# Patient Record
Sex: Male | Born: 1990 | Race: Black or African American | Hispanic: No | Marital: Single | Smoking: Never smoker
Health system: Southern US, Community
[De-identification: ages and names within clinical notes are randomized; demographics above are authoritative.]

## PROBLEM LIST (undated history)

## (undated) DIAGNOSIS — J45909 Unspecified asthma, uncomplicated: Secondary | ICD-10-CM

## (undated) DIAGNOSIS — T7840XA Allergy, unspecified, initial encounter: Secondary | ICD-10-CM

## (undated) HISTORY — PX: NO PAST SURGERIES: SHX2092

## (undated) HISTORY — DX: Allergy, unspecified, initial encounter: T78.40XA

## (undated) HISTORY — DX: Unspecified asthma, uncomplicated: J45.909

---
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
## 2013-08-07 ENCOUNTER — Ambulatory Visit: Payer: BC Managed Care – PPO

## 2013-08-07 ENCOUNTER — Ambulatory Visit: Payer: BC Managed Care – PPO | Admitting: Emergency Medicine

## 2013-08-07 VITALS — BP 104/82 | HR 46 | Temp 97.9°F | Resp 14 | Ht 68.5 in | Wt 171.0 lb

## 2013-08-07 DIAGNOSIS — M20019 Mallet finger of unspecified finger(s): Secondary | ICD-10-CM

## 2013-08-07 NOTE — Progress Notes (Signed)
[facility] [address], [place] [id number] [phone]  Date:  08/07/2013   Name:  Gilbert Buzzardrik M Fortner   DOB:  1990/08/02   MRN:  [id number]  PCP:  No PCP Per Patient    Chief Complaint: Hand Pain   History of Present Illness:  Gilbert [name] is a 23 y.o. very pleasant male patient who presents with the following:  Injured yesterday when he was struck in the tip of the left fourth finger by a basketball. Now cannot extend finger.  There are no active problems to display for this patient.   Past Medical History  Diagnosis Date  . Allergy   . Asthma     History reviewed. No pertinent past surgical history.  History  Substance Use Topics  . Smoking status: Never Smoker   . Smokeless tobacco: Not on file  . Alcohol Use: Not on file    History reviewed. No pertinent family history.  Allergies  Allergen Reactions  . Penicillins     Medication list has been reviewed and updated.  No current outpatient prescriptions on file prior to visit.   No current facility-administered medications on file prior to visit.    Review of Systems:  As per HPI, otherwise negative.    Physical Examination: Filed Vitals:   08/07/13 1608  BP: 104/82  Pulse: 46  Temp: 97.9 F (36.6 C)  Resp: 14   Filed Vitals:   08/07/13 1608  Height: 5' 8.5" (1.74 m)  Weight: 171 lb (77.565 kg)   Body mass index is 25.62 kg/(m^2). Ideal Body Weight: Weight in (lb) to have BMI = 25: 166.5   GEN: WDWN, NAD, Non-toxic, Alert & Oriented x 3 HEENT: Atraumatic, Normocephalic.  Ears and Nose: No external deformity. EXTR: No clubbing/cyanosis/edema NEURO: Normal gait.  PSYCH: Normally interactive. Conversant. Not depressed or anxious appearing.  Calm demeanor.  LEFT hand: mallet finger deformity left fourth finger  Assessment and Plan: Mallet finger   Signed,  Phillips OdorJeffery Krishiv Sandler, MD   [facility] reading (PRIMARY) by  Dr. Dareen PianoAnderson.  Mallet finger.

## 2013-08-07 NOTE — Patient Instructions (Signed)
Mallet Finger A mallet, or jammed, finger occurs when the end of a straightened finger or thumb receives a blow (often from a ball). This causes a disruption (tearing) of the extensor tendon (cord like structure which attaches muscle to bone) that straightens the end of your finger. The last joint in your finger will droop and you cannot extend it. Sometimes this is associated with a small fracture (break in bone) of the base of the end bone (phalange) in your finger. It usually takes 4 to 5 weeks to heal. HOME CARE INSTRUCTIONS   Apply ice to the sore finger for 15-20 minutes, 03-04 times per day for 2 days. Put the ice in a plastic bag and place a towel between the bag of ice and your skin.  If you have a finger splint, wear your splint as directed.  You may remove the splint to wash your finger or as directed.  If your splint is off, do not try to bend the tip of your finger.  Put your splint back on as soon as possible. If your finger is numb or tingling, the splint is probably too tight. You can loosen it so it is comfortable.  Move the part of your injured finger that is not covered by the splint several times a day.  Take medications as directed by your caregiver. Only take over-the-counter or prescription medicines for pain, discomfort, or fever as directed by your caregiver.  IMPORTANT: follow up with your caregiver or keep or call for any appointments with specialists as directed. The failure to follow up could result in chronic pain and / or disability. SEEK MEDICAL CARE IF:   You have increased pain or swelling.  You notice coldness of your finger.  After treatment you still cannot extend your finger. SEEK IMMEDIATE MEDICAL CARE IF:  Your finger is swollen and very red, white, blue, numb, cold, or tingling. MAKE SURE YOU:   Understand these instructions.  Will watch your condition.  Will get help right away if you are not doing well or get worse. Document Released:  03/20/2000 Document Revised: 06/15/2011 Document Reviewed: 11/04/2007 Exit[employer] Patient Information 2014 [employer].  

## 2013-09-12 ENCOUNTER — Telehealth: Payer: Self-pay

## 2013-09-12 NOTE — Telephone Encounter (Signed)
Pt has ortho appt on Thursday morning, grandmother needs xray disc of left index finger  Dois Davenport will pickup--needs Wednesday if possible  Dois Davenport  [phone]

## 2014-07-05 ENCOUNTER — Ambulatory Visit (INDEPENDENT_AMBULATORY_CARE_PROVIDER_SITE_OTHER): Payer: Self-pay | Admitting: Family Medicine

## 2014-07-05 VITALS — BP 118/80 | HR 52 | Temp 98.6°F | Ht 69.5 in | Wt 177.5 lb

## 2014-07-05 DIAGNOSIS — R059 Cough, unspecified: Secondary | ICD-10-CM

## 2014-07-05 DIAGNOSIS — R058 Other specified cough: Secondary | ICD-10-CM

## 2014-07-05 DIAGNOSIS — R05 Cough: Secondary | ICD-10-CM

## 2014-07-05 DIAGNOSIS — J209 Acute bronchitis, unspecified: Secondary | ICD-10-CM

## 2014-07-05 MED ORDER — BENZONATATE 100 MG PO CAPS: 100.0000 mg | ORAL_CAPSULE | Freq: Three times a day (TID) | ORAL | Status: DC | PRN

## 2014-07-05 MED ORDER — HYDROCODONE-HOMATROPINE 5-1.5 MG/5ML PO SYRP: 5.0000 mL | ORAL_SOLUTION | ORAL | Status: DC | PRN

## 2014-07-05 MED ORDER — AZITHROMYCIN 250 MG PO TABS: ORAL_TABLET | ORAL | Status: DC

## 2014-07-05 NOTE — Patient Instructions (Signed)
Drink plenty of fluids and get enough rest  Take the cough syrup 1 teaspoon every 4 hours as needed at nighttime. It may make you drowsy, and probably is not wise to take it when you're going to work.  Take the cough pills one or 2 pills 3 times daily as needed if necessary for daytime cough. This will not make you drowsy.  Take the antibiotic, azithromycin, 2 pills initially then 1 daily for 4 days  Return if worse or not improving, at which time we might need to do some further studies on you.

## 2014-07-05 NOTE — Progress Notes (Signed)
Subjective: [age] year old male who was treated elsewhere months ago for a bronchitis that he had been having. He was given some cough syrup when she is now finished. He's been coughing every day, but especially at nighttime. 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 He does have a history of springtime allergies. He has not had chronic springtime coughs like this. Nose runs a lot. He has not been running a fever. He does cough somewhat.  Objective: TMs normal. Throat clear. Neck supple without significant nodes. Chest is clear to auscultation. Heart regular without murmurs.  Assessment: Persistent bronchitis, probably a postviral cough to last this long. It could be pertussis.  Plan:  azithromycin Tessalon Hycodan  I do not believe chest x-ray or other labs are necessary today, but he is to return if he gets worse or is not improving

## 2014-07-11 ENCOUNTER — Ambulatory Visit (INDEPENDENT_AMBULATORY_CARE_PROVIDER_SITE_OTHER): Payer: Self-pay | Admitting: Family Medicine

## 2014-07-11 VITALS — BP 118/74 | HR 55 | Temp 97.6°F | Resp 17 | Ht 70.25 in | Wt 177.8 lb

## 2014-07-11 DIAGNOSIS — J069 Acute upper respiratory infection, unspecified: Secondary | ICD-10-CM

## 2014-07-11 DIAGNOSIS — J209 Acute bronchitis, unspecified: Secondary | ICD-10-CM

## 2014-07-11 MED ORDER — ALBUTEROL SULFATE HFA 108 (90 BASE) MCG/ACT IN AERS: 2.0000 | INHALATION_SPRAY | RESPIRATORY_TRACT | Status: AC | PRN

## 2014-07-11 MED ORDER — PREDNISONE 20 MG PO TABS: ORAL_TABLET | ORAL | Status: DC

## 2014-07-11 NOTE — Patient Instructions (Signed)
Continue your cough medications and finished the antibiotics  Use the inhaler 2 inhalations every 4-6 hours as needed for coughing or tightness in chest  Take the prednisone 3 pills daily for 2 days, then 2 daily for 2 days, then 1 daily for 2 days, then one half daily  Try to get plenty of rest and drink plenty of fluids  Return if not improving. I do not need to do labs and x-rays today, but if you are not improving over the next 5 or 6 days will need to put you through more testing.

## 2014-07-11 NOTE — Progress Notes (Signed)
Subjective: 24 year old man who was treated last week for a stretcher problem. However he did not give his medicines refilled for a couple of days. He still has one antibiotic pill left. The cough medication calms things down a little bit but these started coughing more again last night. He wheezes some.  Objective: His TMs are normal. Throat clear. Neck supple without significant nodes. Chest is clear below bit tight on forced expiration.  Assessment: Acute bronchitis with asthmatic component to it probably  Plan: Continue his other medications that we gave him last week. In a tapered dose of prednisone and give him albuterol inhaler. He has had inhaler back when he was in high school, so we probably does have some reactive airways disease. Did not do an x-ray or labs today, but will if he does not improve. Thank you

## 2014-08-08 ENCOUNTER — Ambulatory Visit (INDEPENDENT_AMBULATORY_CARE_PROVIDER_SITE_OTHER): Payer: Self-pay | Admitting: Physician Assistant

## 2014-08-08 VITALS — BP 122/78 | HR 59 | Temp 97.7°F | Resp 15 | Ht 70.25 in | Wt 181.0 lb

## 2014-08-08 DIAGNOSIS — S46911A Strain of unspecified muscle, fascia and tendon at shoulder and upper arm level, right arm, initial encounter: Secondary | ICD-10-CM

## 2014-08-08 DIAGNOSIS — J45909 Unspecified asthma, uncomplicated: Secondary | ICD-10-CM | POA: Insufficient documentation

## 2014-08-08 DIAGNOSIS — J302 Other seasonal allergic rhinitis: Secondary | ICD-10-CM | POA: Insufficient documentation

## 2014-08-08 DIAGNOSIS — S56911A Strain of unspecified muscles, fascia and tendons at forearm level, right arm, initial encounter: Secondary | ICD-10-CM

## 2014-08-08 NOTE — Progress Notes (Signed)
   Subjective:    Patient ID: Gilbert [name], male    DOB: 1990/09/25, 24 y.o.   MRN: [id number]  HPI  Gilbert [name] is a 24 year old male who presents today for follow-up of right elbow strain.  He initially strained his elbow on 07/30/14. He saw his primary care provider who instructed him to rest, apply heat and ice, and take Ibuprofen for pain. Unfortunately, the next day he was laid off work due to his physical limitations with the elbow strain. He needs paperwork filled out in order to allow him to return to work. 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  His elbow pain is feeling much better. He denies any pain or difficulty with range of motion. He has not taken Ibuprofen for pain since 07/31/14, but continues to occasionally ice as a precautionary measure. On 08/03/14, he helped a friend move furniture, and he experienced no pain with heavy lifting. On 08/05/14, he played basketball and had no difficulties with dribbling, shooting, or passing the ball. He denies any pain, swelling, redness, fever, or chills.   Review of Systems  Constitutional: Negative for fever, chills and fatigue.  HENT: Negative.   Eyes: Negative.   Respiratory: Negative for cough and shortness of breath.   Cardiovascular: Negative for chest pain and palpitations.  Gastrointestinal: Negative for nausea, vomiting, diarrhea and constipation.  Musculoskeletal: Negative for myalgias, joint swelling and arthralgias.       Objective:   Physical Exam  Constitutional: He is oriented to person, place, and time. He appears well-developed and well-nourished. No distress.  BP 122/78 mmHg  Pulse 59  Temp(Src) 97.7 F (36.5 C) (Oral)  Resp 15  Ht 5' 10.25" (1.784 m)  Wt 181 lb (82.101 kg)  BMI 25.80 kg/m2  SpO2 98%  HENT:  Head: Normocephalic and atraumatic.  Eyes: Conjunctivae are normal.  Neck: Normal range of motion. Neck supple.  Cardiovascular: Normal rate, regular rhythm and  normal heart sounds.  Exam reveals no gallop and no friction rub.   No murmur heard. Pulmonary/Chest: Effort normal and breath sounds normal. He has no wheezes. He has no rhonchi. He has no rales.  Musculoskeletal: Normal range of motion.       Right shoulder: Normal. He exhibits normal range of motion.       Right elbow: Normal.He exhibits normal range of motion and no swelling. No tenderness found.       Right wrist: Normal. He exhibits normal range of motion.  Neurological: He is alert and oriented to person, place, and time.  Skin: Skin is warm and dry.  Psychiatric: He has a normal mood and affect. His behavior is normal.      Assessment & Plan:  1. Elbow strain, right, initial encounter He was initially evaluated elsewhere for elbow strian. He has no complaints of pain or decreased range of motion. Non-tender on physical exam. Paperwork was filled out enabling him to return to work. Paperwork was faxed to [employer]. Advised him to return if he experiences any further issues wit his elbow.

## 2014-08-08 NOTE — Progress Notes (Signed)
   Patient ID: Gilbert [name], male    DOB: [birth year], 24 y.o.   MRN: [id number]  PCP: No PCP Per Patient  Subjective:   Chief Complaint  Patient presents with  . Follow-up    Rt. strained elbow    HPI Presents for evaluation of RIGHT elbow strain.  He initially strained his elbow on 07/30/14. He saw his primary care provider who instructed him to rest, apply heat and ice, and take Ibuprofen for pain. Unfortunately, the next day he was laid off work due to his physical limitations with the elbow strain. He needs paperwork filled out in order to allow him to return to work. 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  His elbow pain is feeling much better. He denies any pain or difficulty with range of motion. He has not taken Ibuprofen for pain since 07/31/14, but continues to occasionally ice as a precautionary measure. On 08/03/14, he helped a friend move furniture, and he experienced no pain with heavy lifting. On 08/05/14, he played basketball and had no difficulties with dribbling, shooting, or passing the ball. He denies any pain, swelling, redness, fever, or chills.  Review of Systems  Constitutional: Negative for fever and chills.  Respiratory: Negative for shortness of breath.   Cardiovascular: Negative for chest pain.  Gastrointestinal: Negative for nausea, vomiting, diarrhea and constipation.  Musculoskeletal: Negative for myalgias, joint swelling and arthralgias.  Skin: Negative for rash.       Patient Active Problem List   Diagnosis Date Noted  . Seasonal allergies 08/08/2014  . 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 Authorizing Provider  albuterol (PROVENTIL HFA;VENTOLIN HFA) 108 (90 BASE) MCG/ACT inhaler Inhale 2 puffs into the lungs every 4 (four) hours as needed for wheezing or shortness of breath (cough, shortness of breath or wheezing.). 07/11/14  Yes Peyton Najjaravid H  Hopper, MD  fexofenadine (ALLEGRA) 30 MG tablet Take 30 mg by mouth 2 (two) times daily.   Yes Historical Provider, MD  fluticasone (FLONASE) 50 MCG/ACT nasal spray Place 1 spray into both nostrils daily.   Yes Historical Provider, MD     Allergies  Allergen Reactions  . Penicillins        Objective:  Physical Exam  Constitutional: He is oriented to person, place, and time. He appears well-developed and well-nourished. He is active and cooperative. No distress.  BP 122/78 mmHg  Pulse 59  Temp(Src) 97.7 F (36.5 C) (Oral)  Resp 15  Ht 5' 10.25" (1.784 m)  Wt 181 lb (82.101 kg)  BMI 25.80 kg/m2  SpO2 98%   Eyes: Conjunctivae are normal.  Pulmonary/Chest: Effort normal.  Musculoskeletal:       Right elbow: Normal.      Right wrist: Normal.       Right forearm: Normal.       Right hand: Normal.  Neurological: He is alert and oriented to person, place, and time.  Psychiatric: He has a normal mood and affect. His speech is normal and behavior is normal.           Assessment & Plan:   1. Elbow strain, right, initial encounter Resolved. Forms completed for return to work without restrictions.   Fernande Brashelle S. Bayden Gil, PA-C Physician Assistant-Certified [facility] .

## 2015-07-12 IMAGING — CR DG FINGER RING 2+V*L*
1 series · 1 of 1 positions shown · non-contrast
Comparison: None.

CLINICAL DATA: Jammed finger playing basketball.  Pain.

EXAM:
LEFT RING FINGER 2+V

[PA]
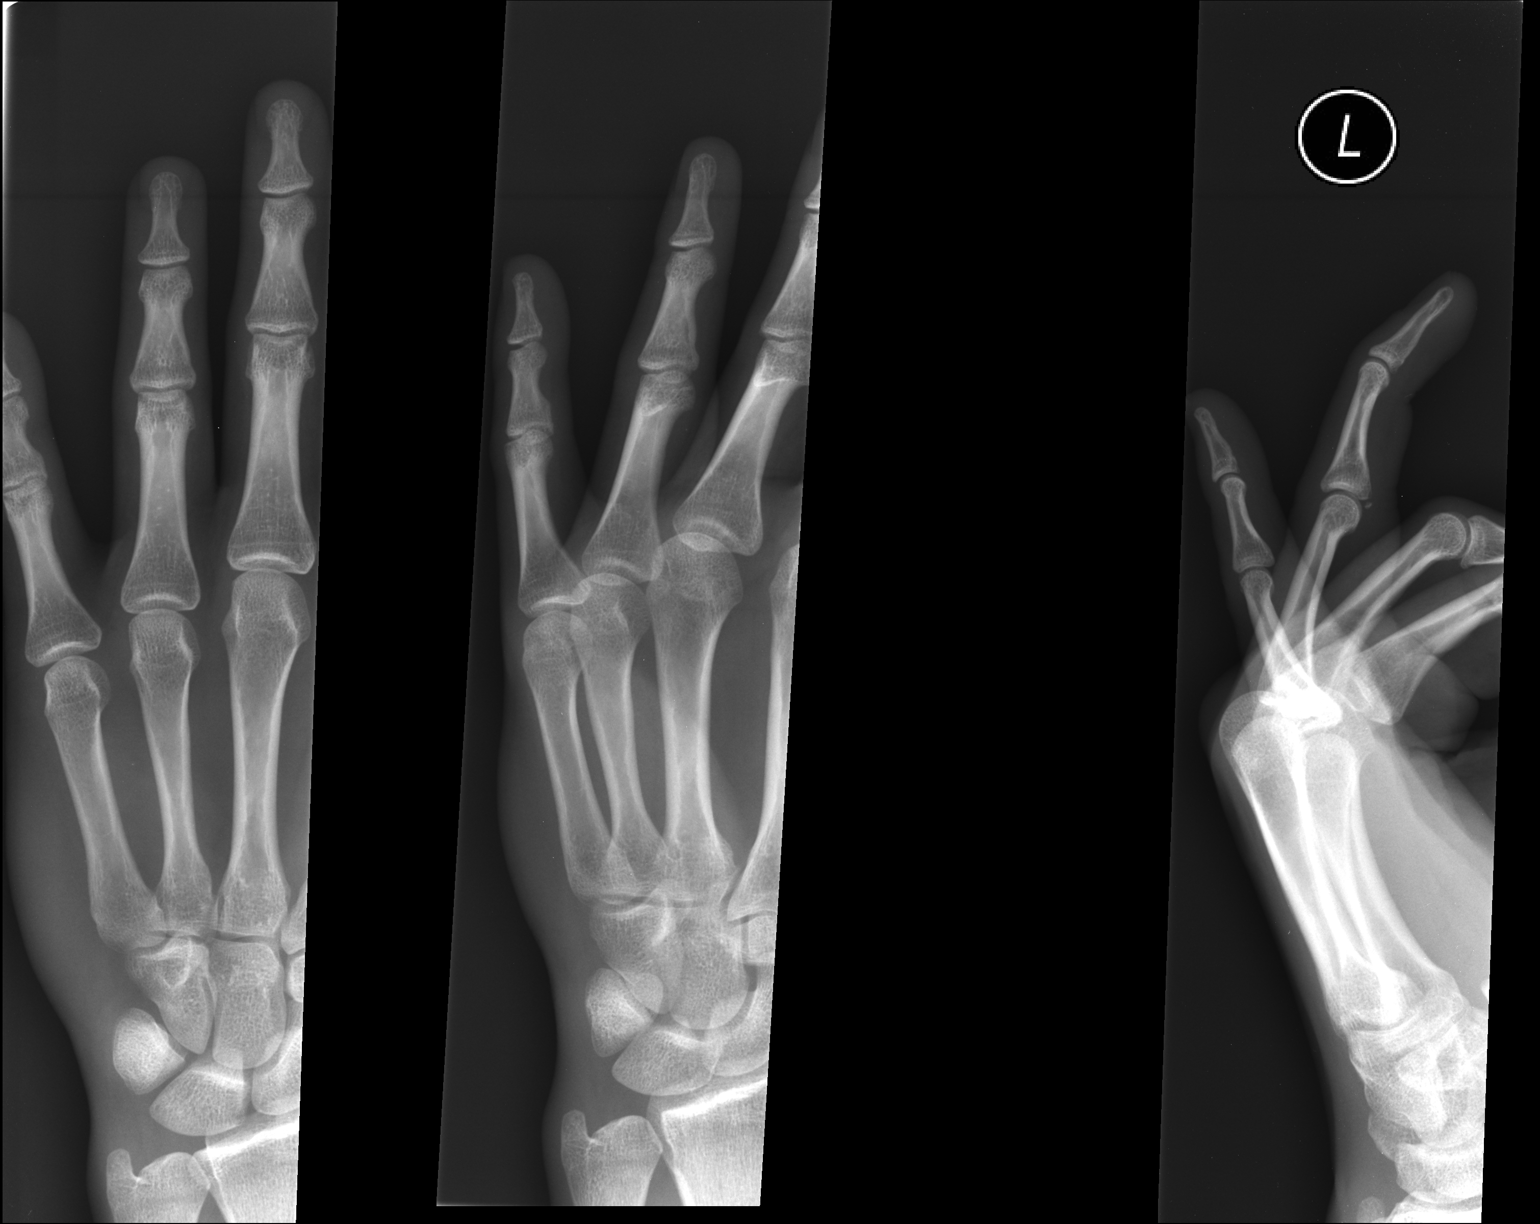

[1 of 1 positions shown; findings below may reference images not displayed]

FINDINGS: There is a mallet finger deformity at the DIP joint of the fourth
finger. There may also be chip fracture off the proximal aspect of
the middle phalanx ventrally at the PIP joint. Soft tissue swelling.
IMPRESSION: As above.

## 2018-10-17 ENCOUNTER — Other Ambulatory Visit: Payer: Self-pay

## 2018-10-17 ENCOUNTER — Ambulatory Visit
Admission: EM | Admit: 2018-10-17 | Discharge: 2018-10-17 | Disposition: A | Discharge status: Home / Self Care | Payer: BC Managed Care – PPO | Attending: Family Medicine | Admitting: Family Medicine

## 2018-10-17 DIAGNOSIS — S76211A Strain of adductor muscle, fascia and tendon of right thigh, initial encounter: Secondary | ICD-10-CM | POA: Diagnosis not present

## 2018-10-17 DIAGNOSIS — Y9361 Activity, american tackle football: Secondary | ICD-10-CM

## 2018-10-17 MED ORDER — HYDROCODONE-ACETAMINOPHEN 5-325 MG PO TABS: 2.0000 | ORAL_TABLET | ORAL | 0 refills | Status: AC | PRN

## 2018-10-17 NOTE — Discharge Instructions (Signed)
Rest, ice, ibuprofen as needed °

## 2018-10-17 NOTE — ED Triage Notes (Signed)
Patient complains of groin pain that occurred while playing football over the weekend. Patient states that he thinks he extended his leg too much and pulled a muscle in his groin.

## 2018-10-17 NOTE — ED Provider Notes (Signed)
[facility]    CSN: [id number] Arrival date & time: 10/17/18  1009     History   Chief Complaint Chief Complaint  Patient presents with  . Groin Pain    HPI TRASE Gilbert [name] is a 28 y.o. male.   28 yo male with a c/o right groin pain since Saturday when he injured it while playing football. States he did a sudden move to sprint when he suddenly felt something pull and pain. Denies direct traumatic injury or fall.    Groin Pain    Past Medical History:  Diagnosis Date  . Allergy   . Asthma     Patient Active Problem List   Diagnosis Date Noted  . Seasonal allergies 08/08/2014  . 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 NO PAST SURGERIES         Home Medications    Prior to Admission medications   Medication Sig Start Date End Date Taking? Authorizing Provider  albuterol (PROVENTIL HFA;VENTOLIN HFA) 108 (90 BASE) MCG/ACT inhaler Inhale 2 puffs into the lungs every 4 (four) hours as needed for wheezing or shortness of breath (cough, shortness of breath or wheezing.). 07/11/14  Yes Posey Boyer, MD  fexofenadine (ALLEGRA) 30 MG tablet Take 30 mg by mouth 2 (two) times daily.   Yes [provider]  fluticasone (FLONASE) 50 MCG/ACT nasal spray Place 1 spray into both nostrils daily.   Yes [provider]  HYDROcodone-acetaminophen (NORCO/VICODIN) 5-325 MG tablet Take 2 tablets by mouth every 4 (four) hours as needed. 10/17/18   Norval Gable, MD    Family History History reviewed. No pertinent family history.  Social History Social History   Tobacco Use  . Smoking status: Never Smoker  . Smokeless tobacco: Never Used  Substance Use Topics  . Alcohol use: No    Alcohol/week: 0.0 standard drinks  . Drug use: No     Allergies   Penicillins   Review of Systems Review of Systems   Physical Exam Triage Vital Signs ED Triage Vitals  Enc Vitals Group     BP 10/17/18 1027 119/84      Pulse Rate 10/17/18 1027 66     Resp 10/17/18 1027 18     Temp 10/17/18 1027 98.2 F (36.8 C)     Temp Source 10/17/18 1027 Oral     SpO2 10/17/18 1027 100 %     Weight 10/17/18 1025 193 lb (87.5 kg)     Height 10/17/18 1025 5\' 10"  (1.778 m)     Head Circumference --      Peak Flow --      Pain Score 10/17/18 1025 7     Pain Loc --      Pain Edu? --      Excl. in [place]? --    No data found.  Updated Vital Signs BP 119/84 (BP Location: Left Arm)   Pulse 66   Temp 98.2 F (36.8 C) (Oral)   Resp 18   Ht 5\' 10"  (1.778 m)   Wt 87.5 kg   SpO2 100%   BMI 27.69 kg/m   Visual Acuity Right Eye Distance:   Left Eye Distance:   Bilateral Distance:    Right Eye Near:   Left Eye Near:    Bilateral Near:     Physical Exam Vitals signs and nursing note reviewed.  Constitutional:      General: He is not in acute distress.  Appearance: He is not toxic-appearing or diaphoretic.  Abdominal:     Hernia: There is no hernia in the right inguinal area.  Musculoskeletal:     Right hip: He exhibits tenderness (tenderness to right groin muscle). He exhibits normal range of motion, normal strength, no bony tenderness, no swelling, no crepitus, no deformity and no laceration.  Lymphadenopathy:     Lower Body: No right inguinal adenopathy.  Neurological:     Mental Status: He is alert.      UC Treatments / Results  Labs (all labs ordered are listed, but only abnormal results are displayed) Labs Reviewed - No data to display  EKG   Radiology No results found.  Procedures Procedures (including critical care time)  Medications Ordered in UC Medications - No data to display  Initial Impression / Assessment and Plan / UC Course  I have reviewed the triage vital signs and the nursing notes.  Pertinent labs & imaging results that were available during my care of the patient were reviewed by me and considered in my medical decision making (see chart for details).       Final Clinical Impressions(s) / UC Diagnoses   Final diagnoses:  Groin strain, right, initial encounter     Discharge Instructions     Rest, ice, ibuprofen as needed    ED Prescriptions    Medication Sig Dispense Auth. Provider   HYDROcodone-acetaminophen (NORCO/VICODIN) 5-325 MG tablet Take 2 tablets by mouth every 4 (four) hours as needed. 6 tablet Payton Mccallumonty, Nayden Czajka, MD     1. diagnosis reviewed with patient 2. rx as per orders above; reviewed possible side effects, interactions, risks and benefits  3. Recommend supportive treatment as above 4. Follow-up prn if symptoms worsen or don't improve   Controlled Substance Prescriptions [place] Controlled Substance Registry consulted? Yes, I have consulted the [place] Controlled Substances Registry for this patient, and feel the risk/benefit ratio today is favorable for proceeding with this prescription for a controlled substance.   Payton Mccallumonty, Jesseka Drinkard, MD 10/17/18 1112
